# Patient Record
Sex: Male | Born: 1960 | Race: Black or African American | Hispanic: No | Marital: Single | Smoking: Never smoker
Health system: Southern US, Community
[De-identification: ages and names within clinical notes are randomized; demographics above are authoritative.]

---

## 2011-09-21 ENCOUNTER — Ambulatory Visit: Payer: Self-pay | Admitting: Gastroenterology

## 2012-04-19 ENCOUNTER — Emergency Department: Payer: Self-pay | Admitting: Emergency Medicine

## 2012-08-25 ENCOUNTER — Emergency Department: Payer: Self-pay | Admitting: Emergency Medicine

## 2012-08-25 LAB — CBC WITH DIFFERENTIAL/PLATELET
Basophil %: 0.7 %
Eosinophil #: 0.8 10*3/uL — ABNORMAL HIGH (ref 0.0–0.7)
Eosinophil %: 11.6 %
Lymphocyte #: 1.5 10*3/uL (ref 1.0–3.6)
Lymphocyte %: 22.5 %
MCHC: 35.4 g/dL (ref 32.0–36.0)
Monocyte #: 0.6 x10 3/mm (ref 0.2–1.0)
Neutrophil %: 56 %
Platelet: 151 10*3/uL (ref 150–440)
RDW: 13.5 % (ref 11.5–14.5)
WBC: 6.7 10*3/uL (ref 3.8–10.6)

## 2012-08-25 LAB — COMPREHENSIVE METABOLIC PANEL
Albumin: 4 g/dL (ref 3.4–5.0)
Alkaline Phosphatase: 108 U/L (ref 50–136)
Anion Gap: 9 (ref 7–16)
Calcium, Total: 9.4 mg/dL (ref 8.5–10.1)
Co2: 27 mmol/L (ref 21–32)
Creatinine: 1.12 mg/dL (ref 0.60–1.30)
EGFR (African American): >60
Glucose: 109 mg/dL — ABNORMAL HIGH (ref 65–99)
Osmolality: 282 (ref 275–301)
Potassium: 4 mmol/L (ref 3.5–5.1)
Total Protein: 8.3 g/dL — ABNORMAL HIGH (ref 6.4–8.2)

## 2012-11-26 ENCOUNTER — Emergency Department: Payer: Self-pay | Admitting: Emergency Medicine

## 2013-08-10 ENCOUNTER — Inpatient Hospital Stay: Payer: Self-pay | Admitting: Family Medicine

## 2013-08-10 LAB — CBC
HCT: 47.7 % (ref 40.0–52.0)
HGB: 15.8 g/dL (ref 13.0–18.0)
MCH: 32 pg (ref 26.0–34.0)
MCHC: 33.1 g/dL (ref 32.0–36.0)
MCV: 96 fL (ref 80–100)
Platelet: 151 10*3/uL (ref 150–440)
RBC: 4.94 10*6/uL (ref 4.40–5.90)
RDW: 13.7 % (ref 11.5–14.5)
WBC: 9 10*3/uL (ref 3.8–10.6)

## 2013-08-10 LAB — COMPREHENSIVE METABOLIC PANEL
ALK PHOS: 91 U/L
ALT: 28 U/L (ref 12–78)
AST: 27 U/L (ref 15–37)
Albumin: 3.6 g/dL (ref 3.4–5.0)
Anion Gap: 5 — ABNORMAL LOW (ref 7–16)
BUN: 13 mg/dL (ref 7–18)
Bilirubin,Total: 1 mg/dL (ref 0.2–1.0)
CREATININE: 0.99 mg/dL (ref 0.60–1.30)
Calcium, Total: 9 mg/dL (ref 8.5–10.1)
Chloride: 104 mmol/L (ref 98–107)
Co2: 27 mmol/L (ref 21–32)
EGFR (African American): >60
EGFR (Non-African Amer.): >60
Glucose: 99 mg/dL (ref 65–99)
OSMOLALITY: 272 (ref 275–301)
Potassium: 3.9 mmol/L (ref 3.5–5.1)
Sodium: 136 mmol/L (ref 136–145)
TOTAL PROTEIN: 7.9 g/dL (ref 6.4–8.2)

## 2013-08-10 LAB — TROPONIN I: Troponin-I: <0.02 ng/mL

## 2013-08-11 LAB — TSH: Thyroid Stimulating Horm: 0.165 u[IU]/mL — ABNORMAL LOW

## 2013-08-12 LAB — CBC WITH DIFFERENTIAL/PLATELET
Basophil #: 0 10*3/uL (ref 0.0–0.1)
Basophil %: 0.2 %
EOS PCT: 0 %
Eosinophil #: 0 10*3/uL (ref 0.0–0.7)
HCT: 43 % (ref 40.0–52.0)
HGB: 14.5 g/dL (ref 13.0–18.0)
Lymphocyte #: 0.9 10*3/uL — ABNORMAL LOW (ref 1.0–3.6)
Lymphocyte %: 5.7 %
MCH: 32.6 pg (ref 26.0–34.0)
MCHC: 33.7 g/dL (ref 32.0–36.0)
MCV: 97 fL (ref 80–100)
MONOS PCT: 3.1 %
Monocyte #: 0.5 x10 3/mm (ref 0.2–1.0)
NEUTROS ABS: 15.1 10*3/uL — AB (ref 1.4–6.5)
NEUTROS PCT: 91 %
Platelet: 166 10*3/uL (ref 150–440)
RBC: 4.45 10*6/uL (ref 4.40–5.90)
RDW: 13.7 % (ref 11.5–14.5)
WBC: 16.6 10*3/uL — AB (ref 3.8–10.6)

## 2013-08-12 LAB — BASIC METABOLIC PANEL
ANION GAP: 5 — AB (ref 7–16)
BUN: 18 mg/dL (ref 7–18)
CHLORIDE: 104 mmol/L (ref 98–107)
CREATININE: 0.93 mg/dL (ref 0.60–1.30)
Calcium, Total: 9.2 mg/dL (ref 8.5–10.1)
Co2: 26 mmol/L (ref 21–32)
EGFR (African American): >60
GLUCOSE: 144 mg/dL — AB (ref 65–99)
OSMOLALITY: 275 (ref 275–301)
Potassium: 4.5 mmol/L (ref 3.5–5.1)
Sodium: 135 mmol/L — ABNORMAL LOW (ref 136–145)

## 2014-08-21 NOTE — H&P (Signed)
PATIENT NAME:  Pedro Flores, Pedro Flores MR#:  [id number] DATE OF BIRTH:  03-May-1960  DATE OF ADMISSION:  08/10/2013  PRIMARY CARE PHYSICIAN: Mariane Masters, MD  REFERRING PHYSICIAN: Dr. Cyril Loosen  CHIEF COMPLAINT: Shortness of breath, cough, and wheezing for 4 days.   HISTORY OF PRESENT ILLNESS: This is a 54 year old African American male with a history of asthma, GERD, and TB who presented to the ED with shortness of breath, cough, and wheezing for the past 4 days. Symptoms have been worsening. The patient denies any fever or chills. No chest pain, palpitation, orthopnea, or nocturnal dyspnea or leg edema. The patient was treated with nebulizer without improvement in the ED. The patient will be admitted for COPD exacerbation.   PAST MEDICAL HISTORY: As mentioned above, GERD, asthma, and TB.   SOCIAL HISTORY: No smoking or drinking or illicit drugs.   FAMILY HISTORY: Both parents died. The patient does not know.  PAST SURGICAL HISTORY: None.   ALLERGIES: None.   HOME MEDICATIONS: 1.  Toviaz 8 mg p.o. daily.  2.  ProAir HFA CFC 19 mcg 2 tabs 4 times a day p.r.n.  3.  Omeprazole 20 mg p.o. daily.  4.  Singulair 10 mg 1 tablet in the evening.  5.  Loratadine 10 mg p.o. daily. 6.  Atorvastatin 20 mg p.o. daily.   REVIEW OF SYSTEMS: CONSTITUTIONAL: The patient denies any fever or chills. No headache or dizziness. No weakness.  EYES: No double vision, blurred vision. ENT: No postnasal drip, slurred speech or dysphagia.  CARDIOVASCULAR: No chest pain, palpitations, orthopnea, nocturnal dyspnea. No leg edema.  PULMONARY: Positive for cough, sputum, shortness of breath, and wheezing, but no hemoptysis.  GASTROINTESTINAL: No abdominal pain, nausea, vomiting or diarrhea. No melena or bloody stool. GENITOURINARY: No dysuria, hematuria, or incontinence.  SKIN: No rash or jaundice.  NEUROLOGY: No syncope, loss of consciousness or seizure.  HEMATOLOGIC: No easy bruising or bleeding.  ENDOCRINE: No  polyuria, polydipsia, heat or cold intolerance.   PHYSICAL EXAMINATION: VITAL SIGNS: Temperature 98.2, blood pressure 117/66, pulse 89, respirations 18, O2 saturation 92% in room air.  GENERAL: The patient is alert, awake, and oriented, in no acute distress.  HEENT: Pupils round, equal and reactive to light and accommodation. Moist oral mucosa. Clear oropharynx.  NECK: Supple. No JVD or carotid bruit. No lymphadenopathy. No thyromegaly.  CARDIOVASCULAR: S1 and S2 regular rate and rhythm. No murmurs or gallops. PULMONARY: Bilateral air entry. No wheezing or rales. No use of accessory muscles. Bilateral air entry. There is wheezing. No rales. No use of accessory muscles to breathe.  ABDOMEN: Soft. No distention or tenderness. No organomegaly. Bowel sounds present.  EXTREMITIES: No edema, clubbing or cyanosis. No calf tenderness. Strong bilateral pedal pulses.  SKIN: No rash or jaundice.  NEUROLOGIC: Alert and oriented x3. No focal deficit. Power 5/5. Sensation intact.   DIAGNOSTIC DATA: CBC in normal range. Glucose 99, BUN 13, creatinine 0.99. Electrolytes normal. Troponin less than 0.02.   Chest x-ray: No edema or consolidation.   IMPRESSIONS: 1.  Chronic obstructive pulmonary disease exacerbation.  2.  History of asthma.  3.  Gastroesophageal reflux disease.  PLAN OF TREATMENT: 1.  The patient will be admitted to medical floor. We will start Solu-Medrol IVPB and give DuoNeb. Continue Singulair.  2.  Gastrointestinal and deep vein thrombosis prophylaxis.   I discussed the patient's condition and plan of treatment with the patient.   TIME SPENT: About 52 minutes.  ____________________________ Shaune Pollack, MD qc:sb D: 08/10/2013 13:24:38 ET  T: 08/10/2013 13:41:33 ET JOB#: [id number]  cc: Shaune PollackQing Lafreda Casebeer, MD, <Dictator> Shaune PollackQING Raina Sole MD ELECTRONICALLY SIGNED 08/10/2013 15:40

## 2014-08-21 NOTE — Discharge Summary (Signed)
PATIENT NAME:  Pedro Flores MR#:  [id number] DATE OF BIRTH:  Jan 11, 1961  DATE OF ADMISSION:  08/10/2013 DATE OF DISCHARGE:  08/14/2013  DISPOSITION: Home. The patient also has a referral to go to a group home, which he is on a waiting period for that.   MEDICATIONS AT DISCHARGE: Claritin 10 mg once daily, Atorvastatin 20 mg at bedtime. ProAir HFA 2 puffs 4 times a day as needed for shortness of breath. Singulair 10  mg once a day, omeprazole 20 mg once a day, Toviaz 8 mg once a day, prednisone taper starting at 60 mg taper 10 mg every day until they are gone. Advair Diskus 500/50 mcg inhaler twice daily.   FOLLOW-UP: PCP  in the next 2 to 4 weeks.   HOSPITAL COURSE: This is a very nice and super sweet 54 year old gentleman who has history of asthma, GERD, TB [place] the [place] in [place] was [place]. The patient has the diagnosis that has been given from whenever he was a child of mental retardation and at this moment mostly developmental delay. The patient has been living in group homes in the past. The patient recently moved into the house of his brother who has been trying to take care of him. The patient, apparently, was not being able to take all his medications and his niece is the one who has been dealing with all his medical care, but she has a family and she does not live in the same house. His brother was supposed to be the one giving most of the care, but as per the niece, the patient has not been receiving the appropriate care due to multiple issues. Despite of that the patient came to the emergency department on 08/12/2013 complaining of shortness of breath. He has a diagnosis of asthma and he has been wheezing for 4 days. In the Emergency Department, the patient had oxygen saturation of 92% on room air with significant respiratory wheezing and tightness for which he was admitted to the hospital. He was started Solu-Medrol, continue Singulair and continue Claritin.  I started the  patient on Advair. The patient started to improve slowly, but he had a good course. As far as his other medical problems, the patient is going to a group home for what a psychiatric evaluation was needed. Dr. Toni Amendlapacs was kind enough to evaluate him for the clearance.   His white blood cells were elevated due to the steroids. His mental capacity was decreased to developmental delay, and this is not a change to his baseline.   He has hyperlipidemia, for what he is continued statins. He has overreactive bladder for what he takes Toviaz, and overall, the patient did well during this hospitalization, was discharged in good condition.   TIME SPENT: I spent about 45 minutes discharging this patient.   ____________________________ Pedro Furnaceoberto Pedro Gutierrez, Flores rsg:sg D: 08/16/2013 04:53:00 ET T: 08/16/2013 07:08:33 ET JOB#: [id number]  cc: Pedro Furnaceoberto Pedro Gutierrez, Flores, <Dictator> Hikari Tripp Juanda ChanceSANCHEZ GUTIERRE Flores ELECTRONICALLY SIGNED 09/01/2013 22:51

## 2014-08-21 NOTE — Consult Note (Signed)
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   Patient was seen by consultant.   Consult note dictated.   Comments: Psychiatry: Patient seen and chart reviewed and I spoke with his neice. Patient has no current or past psychiatric problems identified. Full note done.  Electronic Signatures: Besse Miron, Jackquline DenmarkJohn T (MD)  (Signed 15-Apr-15 18:00)  Authored: Brief Consult Note   Last Updated: 15-Apr-15 18:00 by Audery Amellapacs, Caymen Dubray T (MD)

## 2014-08-21 NOTE — Consult Note (Signed)
PATIENT NAME:  Pedro Flores, Pedro Flores MR#:  [id number] DATE OF BIRTH:  10/03/1960  DATE OF CONSULTATION:  08/12/2013  REFERRING PHYSICIAN:   CONSULTING PHYSICIAN:  Audery Amel, MD  IDENTIFYING INFORMATION AND REASON FOR CONSULT: A 54 year old gentleman currently in the hospital because of an exacerbation of his asthma. Consultation because it is required for his group home placement.   HISTORY OF PRESENT ILLNESS: Information obtained from the patient, the chart, but probably more completely from the patient's niece, Ricard Dillon. Consultation was requested because the [facility] application requests a psychiatric evaluation as part of their admission process, not because of any specific complaint or problem.  On review of the chart, there is no indication of the patient having any psychiatric problems or any psychiatric treatment. Nothing would reflect any concerns about behavior or mood problems. When I spoke to the patient, he was pleasant and interactive but clearly became confused when I started asking him questions about his past psychiatric history. His answers appeared unreliable and I just seemed to be making him nervous, so I preferred to get most of the history from his niece. Niece reports that the patient has no history of any psychiatric treatment or problems that she is aware of whatsoever. He does have mild mental retardation but has no history of psychiatric hospitalization or psychiatric medication. She says he has no history of violence or aggression toward other people and no history of self-injury, suicide, or violence toward himself. There is no history of substance abuse problems. The patient in the past has participated in structured workshop type settings and she tells me there has never been any problem there at all. On interview today, the patient tells me that his mood is fine and good. He says he is glad that he is feeling better from his asthma attack. He tells me  that he understands he is going to a group home and he is looking forward to it. He is not currently on any psychiatric medicine.   PAST PSYCHIATRIC HISTORY: To repeat what I have learned above, the patient I am told has no past history of any psychiatric treatment whatsoever. No history of being diagnosed with mood or psychotic disorder. No history of any dangerous behavior. No history of psychiatric hospitalization.   PAST MEDICAL HISTORY: The patient has asthma and is prone to decompensation if he does not stay on his medication regularly. Also has dyslipidemia. Also has gastroesophageal reflux disease.   SOCIAL HISTORY: The patient's parents are deceased. Once they passed away a few years ago, the patient and his brother moved from [place] down here to [place]. The patient has been living with his brother since then. The brother had from what I am told volunteered to take responsibility for maintaining the patient's medical condition; however, according to Ms. McDuffie, in the end she had to do pretty much all of the work. She was expected to keep all the medications filled, do all the transport to medical appointments and still became frustrated that the patient would decompensate, that home cleanliness was not adequate, and in general felt that her father was not really up to the challenge. Because Ms. McDuffie has a full-time job and family and her own life, she feels that it would be better for the patient to move into a group home. He has been in that sort of environment in the past and has functioned well there. The family intends to still be very actively involved in his  life.   SUBSTANCE ABUSE HISTORY: No history of substance abuse problems identified at all.   CURRENT MEDICATIONS: Atorvastatin 20 mg at night, Toviaz 4 mg per day, Claritin 10 mg per day. Currently, he is getting Solu-Medrol IV, which presumably will be stopped before discharge. Singulair 10 mg per day, pantoprazole 40  mg per day, albuterol ipratropium inhaler, currently has nebulizers.   ALLERGIES:  No known drug allergies.   REVIEW OF SYSTEMS: Full review of systems is completely negative with this patient.   MENTAL STATUS EXAMINATION: The patient was dressed in a hospital gown sitting up in a chair eating his supper. He was eating in a normal manner and enjoying a television show. He was appropriately interactive and conversant with me. Made good eye contact, psychomotor activity normal. Speech normal tone and volume. Affect: Smiling, a bit confused at times by my questions but appropriate. Mood stated as good. Thoughts are clearly at times simple and slow and he is easily confused by abstract ideas. When I tried to explain the purpose of the psychiatric evaluation, he clearly did not understand and became more confused. However, at no time was he aggressive or even impolite. Totally denied suicidal or homicidal ideation. Denied any hallucinations. His judgment and insight appear to be adequate given his cognitive condition. I did not press on doing a more complete cognitive exam at this time.   ASSESSMENT: This is a 54 year old gentleman who has mild mental retardation without any associated behavior or psychiatric problems. Other than his chronic COPD, he is in pretty good health. According to the family, which is the only source I have for history, the patient has never had any behavior problems at all. Niece says that the patient is a "peach" and a very sweet man who has never been aggressive to anyone as far as she knows.   At this time, I do not have any psychiatric diagnosis other than the obvious mild mental retardation. No need for any psychiatric treatment. I certainly do not see anything here that would be a contraindication to the patient functioning in a group home. It is beyond my capacity in a hospital evaluation to predict exactly how well he is going to function in a living environment, but according  to the niece, the patient can do some basic handling of small amounts of money, can move around a kitchen and do some simple cooking safely, and take care of basic household tasks without difficulty.   DIAGNOSIS, PRINCIPAL AND PRIMARY:  AXIS I: No diagnosis.  AXIS II: Mental retardation, mild without associated psychiatric illness. AXIS III:  1. Chronic obstructive pulmonary disease. 2. Gastric reflux disease.  AXIS IV: Mild to moderate from relocation.  AXIS V: Functioning at time of evaluation, 64.   ____________________________ Audery AmelJohn T. Lajuana Patchell, MD jtc:dd D: 08/12/2013 18:10:00 ET T: 08/12/2013 19:31:17 ET JOB#: [id number]  cc: Audery AmelJohn T. Emmah Bratcher, MD, <Dictator> Audery AmelJOHN T Elliett Guarisco MD ELECTRONICALLY SIGNED 08/13/2013 9:54

## 2014-10-03 ENCOUNTER — Emergency Department
Admission: EM | Admit: 2014-10-03 | Discharge: 2014-10-03 | Disposition: A | Discharge status: Home / Self Care | Payer: Medicaid Other | Attending: Emergency Medicine | Admitting: Emergency Medicine

## 2014-10-03 ENCOUNTER — Emergency Department: Payer: Medicaid Other

## 2014-10-03 ENCOUNTER — Encounter: Payer: Self-pay | Admitting: Emergency Medicine

## 2014-10-03 DIAGNOSIS — J45909 Unspecified asthma, uncomplicated: Secondary | ICD-10-CM | POA: Insufficient documentation

## 2014-10-03 DIAGNOSIS — Z79899 Other long term (current) drug therapy: Secondary | ICD-10-CM | POA: Diagnosis not present

## 2014-10-03 DIAGNOSIS — Z7951 Long term (current) use of inhaled steroids: Secondary | ICD-10-CM | POA: Diagnosis not present

## 2014-10-03 DIAGNOSIS — H6123 Impacted cerumen, bilateral: Secondary | ICD-10-CM | POA: Insufficient documentation

## 2014-10-03 DIAGNOSIS — H9203 Otalgia, bilateral: Secondary | ICD-10-CM

## 2014-10-03 DIAGNOSIS — R0981 Nasal congestion: Secondary | ICD-10-CM | POA: Diagnosis present

## 2014-10-03 MED ORDER — BUDESONIDE 1 MG/2ML IN SUSP: 1.0000 mg | Freq: Every day | RESPIRATORY_TRACT | Status: DC

## 2014-10-03 NOTE — Discharge Instructions (Signed)
Cerumen Impaction A cerumen impaction is when the wax in your ear forms a plug. This plug usually causes reduced hearing. Sometimes it also causes an earache or dizziness. Removing a cerumen impaction can be difficult and painful. The wax sticks to the ear canal. The canal is sensitive and bleeds easily. If you try to remove a heavy wax buildup with a cotton tipped swab, you may push it in further. Irrigation with water, suction, and small ear curettes may be used to clear out the wax. If the impaction is fixed to the skin in the ear canal, ear drops may be needed for a few days to loosen the wax. People who build up a lot of wax frequently can use ear wax removal products available in your local drugstore. SEEK MEDICAL CARE IF:  You develop an earache, increased hearing loss, or marked dizziness. Document Released: 05/24/2004 Document Revised: 07/09/2011 Document Reviewed: 07/14/2009 [facility] Patient Information 2015 [employer] [place]. This information is not intended to replace advice given to you by your health care provider. Make sure you discuss any questions you have with your health care provider.  Keep the ears clear of wax using warm water and peroxide as needed.

## 2014-10-03 NOTE — ED Notes (Signed)
Patient with complaint of cough and congestion times one week. Denies fever.

## 2014-10-03 NOTE — ED Provider Notes (Signed)
[facility] Emergency Department Provider Note? ____________________________________________ ? Time seen: 2205 ? I have reviewed the triage vital signs and the nursing notes. ________ HISTORY ? Chief Complaint Cough and Nasal Congestion  HPI  Pedro Flores is a 54 y.o. male brought in by his adult niece, for complaints of 3 days of decreased hearing in the ears and 2 weeks of cough and congestion. She reports that he has a history of asthma and has been without his Pulmicort nebulizers in that time. She denies any outright fevers chills or sweats and her uncle, and denies productive cough. She is here for evaluation of what she suspects is impacted wax in the ears and a flare of his asthma.    Review of Systems  Constitutional: Negative for fever. Eyes: Negative for visual changes. ENT: Negative for sore throat. Positive for decreased hearing and excessive wax. Cardiovascular: Negative for chest pain. Respiratory: Negative for shortness of breath. Positive for cough Gastrointestinal: Negative for abdominal pain, vomiting and diarrhea. Musculoskeletal: Negative for back pain. Skin: Negative for rash. Neurological: Negative for headaches, focal weakness or numbness.  10-point ROS otherwise negative. ____________________________________________  No past medical history on file.  There are no active problems to display for this patient. ? No past surgical history on file. ? Current Outpatient Rx  Name  Route  Sig  Dispense  Refill  . atorvastatin (LIPITOR) 40 MG tablet   Oral   Take 40 mg by mouth daily.         . budesonide (PULMICORT) 1 MG/2ML nebulizer solution   Nebulization   Take 1 mg by nebulization daily.         . montelukast (SINGULAIR) 10 MG tablet   Oral   Take 10 mg by mouth at bedtime.         Marland Kitchen. omeprazole (PRILOSEC) 40 MG capsule   Oral   Take 40 mg by mouth daily.         . budesonide (PULMICORT) 1 MG/2ML nebulizer  solution   Nebulization   Take 2 mLs (1 mg total) by nebulization daily.   30 mL   0   ? Allergies Review of patient's allergies indicates no known allergies. ? History reviewed. No pertinent family history. ? Social History History  Substance Use Topics  . Smoking status: Never Smoker   . Smokeless tobacco: Not on file  . Alcohol Use: Yes     Comment: occasionaly   PHYSICAL EXAM:  VITAL SIGNS: ED Triage Vitals  Enc Vitals Group     BP 10/03/14 2022 125/86 mmHg     Pulse Rate 10/03/14 2022 97     Resp 10/03/14 2022 18     Temp 10/03/14 2022 98.3 F (36.8 C)     Temp Source 10/03/14 2022 Oral     SpO2 10/03/14 2022 96 %     Weight 10/03/14 2022 160 lb (72.576 kg)     Height 10/03/14 2022 5\' 5"  (1.651 m)     Head Cir --      Peak Flow --      Pain Score --      Pain Loc --      Pain Edu? --      Excl. in GC? --    Constitutional: Alert and oriented. Well appearing and in no distress. Eyes: Conjunctivae are normal. PERRL. Normal extraocular movements. ENT   Head: Normocephalic and atraumatic.   Nose: No congestion/rhinnorhea.      Ears: Bilateral TMs obscurred  by large amount of cerumen in the canals.    Mouth/Throat: Mucous membranes are moist.      Ears: Normal external exam. Canals clear. TMs clear bilaterally.   Neck: Supple. No lymphadenopathy. Cardiovascular: Normal rate, regular rhythm. Normal and symmetric distal pulses are present in all extremities. No murmurs, rubs, or gallops. Respiratory: Normal respiratory effort without tachypnea nor retractions. Breath sounds are clear and equal bilaterally. No wheezes/rales/rhonchi. Gastrointestinal: Soft and nontender.  Musculoskeletal: Normal range of motion in all extremities.  Neurologic:  Normal speech and language. No gross focal neurologic deficits are appreciated.  Skin:  Skin is warm, dry and intact. No rash noted. Psychiatric: Mood and affect are normal. Patient exhibits appropriate insight  and judgment. _____________ PROCEDURES ? Ear wash by this provider Verbal consent obtained Ears washed using water & peroxide Flushed using 18g angiocath  Two large wax plugs removed from both ears without difficulty Patient tolerated the procedure well Hearing restored to baseline per patient. ______________________________________________________ INITIAL IMPRESSION / ASSESSMENT AND PLAN / ED COURSE ?Bilateral cerumen impaction causing acute otalgia. URI symptoms aggravated by meds having run out. Courtesy prescription for Pulmicort given. Follow-up with primary care provider.  ____________________________________________ FINAL CLINICAL IMPRESSION(S) / ED DIAGNOSES?  Final diagnoses:  Otalgia of both ears  Cerumen impaction, bilateral      Lissa Hoard, PA-C 10/03/14 2255  Darien Ramus, MD 10/03/14 [phone]

## 2014-10-03 NOTE — ED Notes (Signed)
Pt brought in by niece from home.  Pt has difficulty hearing tonight in both ears for 3 days.  Denies earache   No drainage from ears.. Pt also has a cough and congestion for 2 weeks.  No fever.  Nonsmoker.

## 2017-03-15 IMAGING — CR DG CHEST 2V
1 series · 2 of 2 positions shown · non-contrast
Comparison: 08/10/2013

CLINICAL DATA: Cough and nasal congestion.  Worsened today.

EXAM:
CHEST  2 VIEW

[Series 1: dg chest 2 view · 0.14mm/px · 2 of 2 slices shown]
[im 1/2]
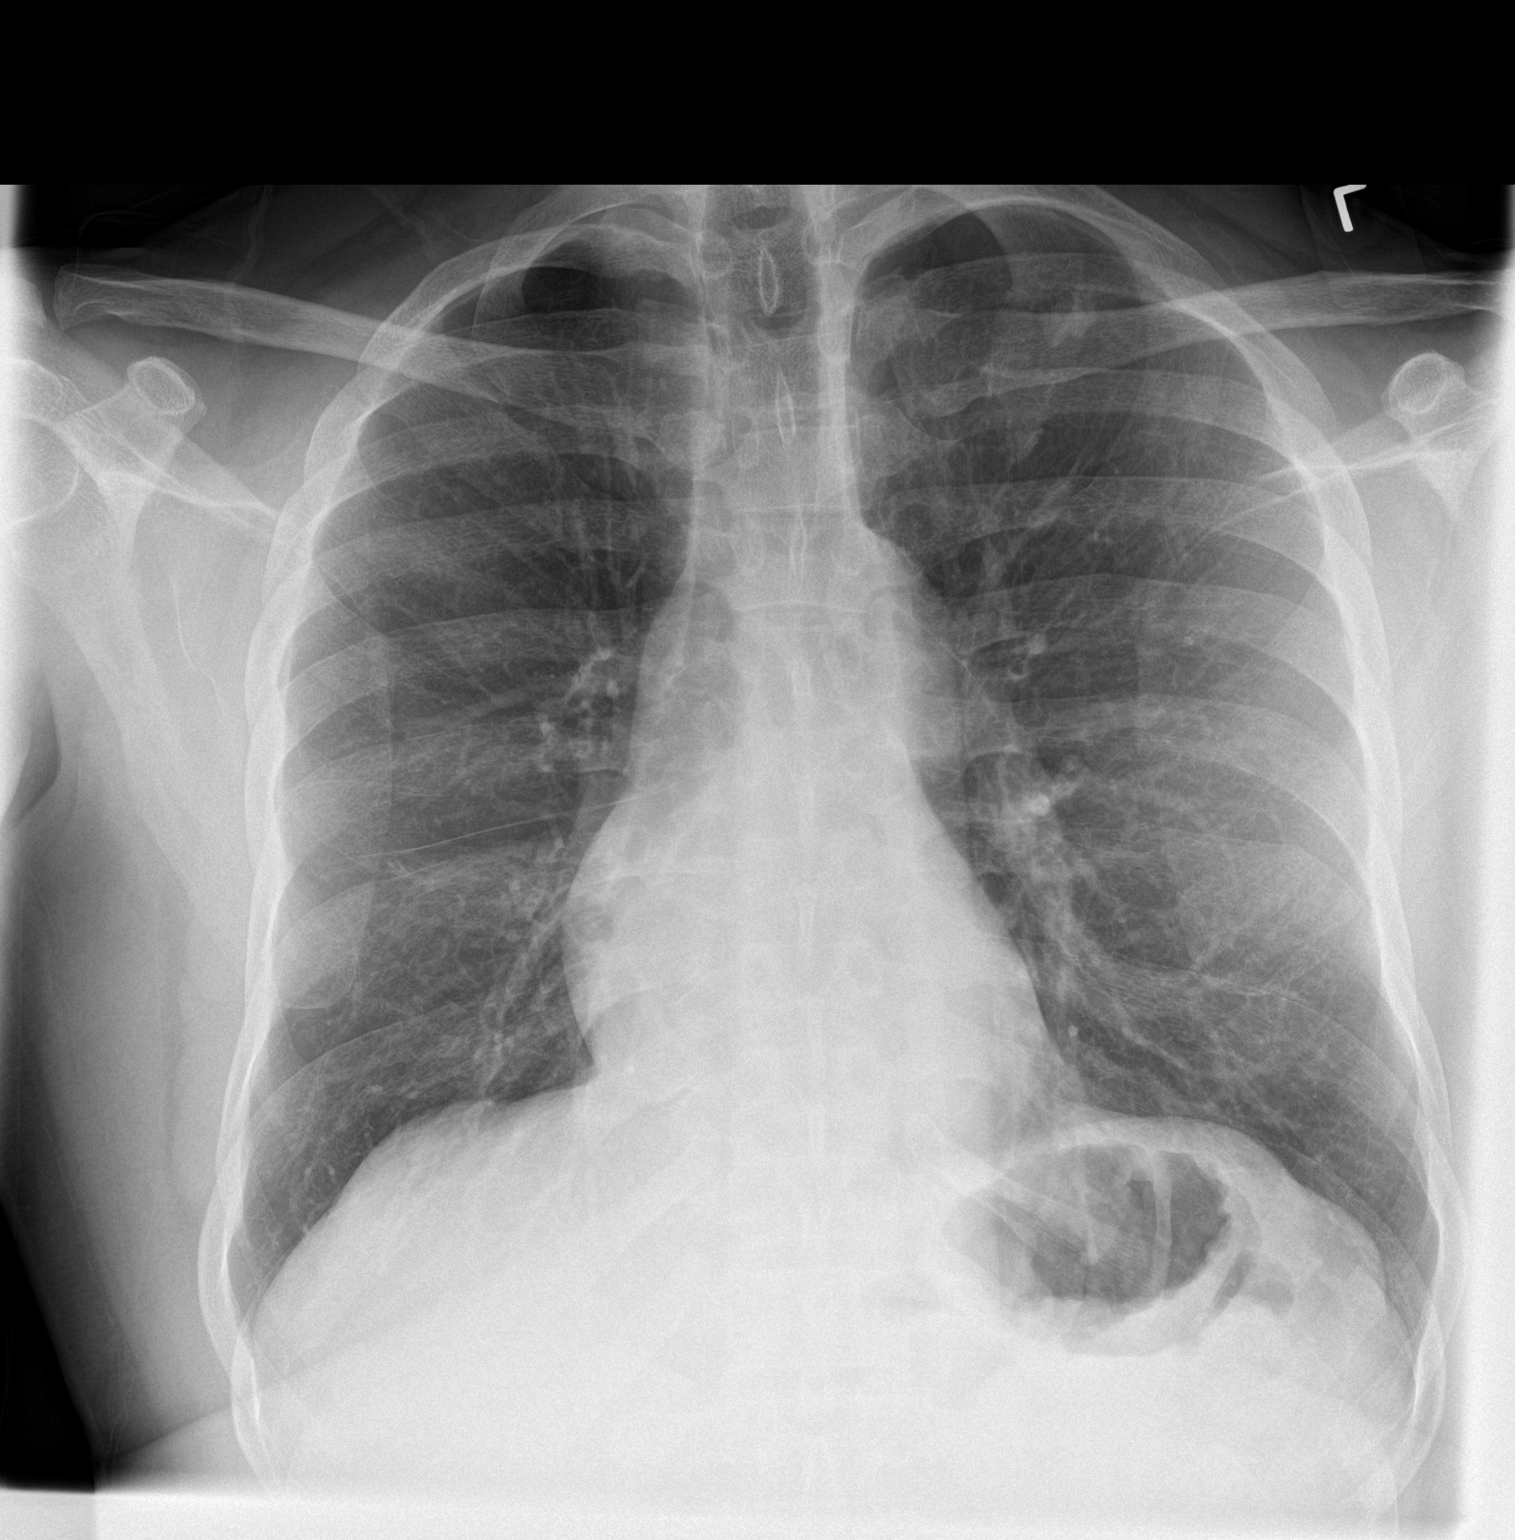
[im 2/2]
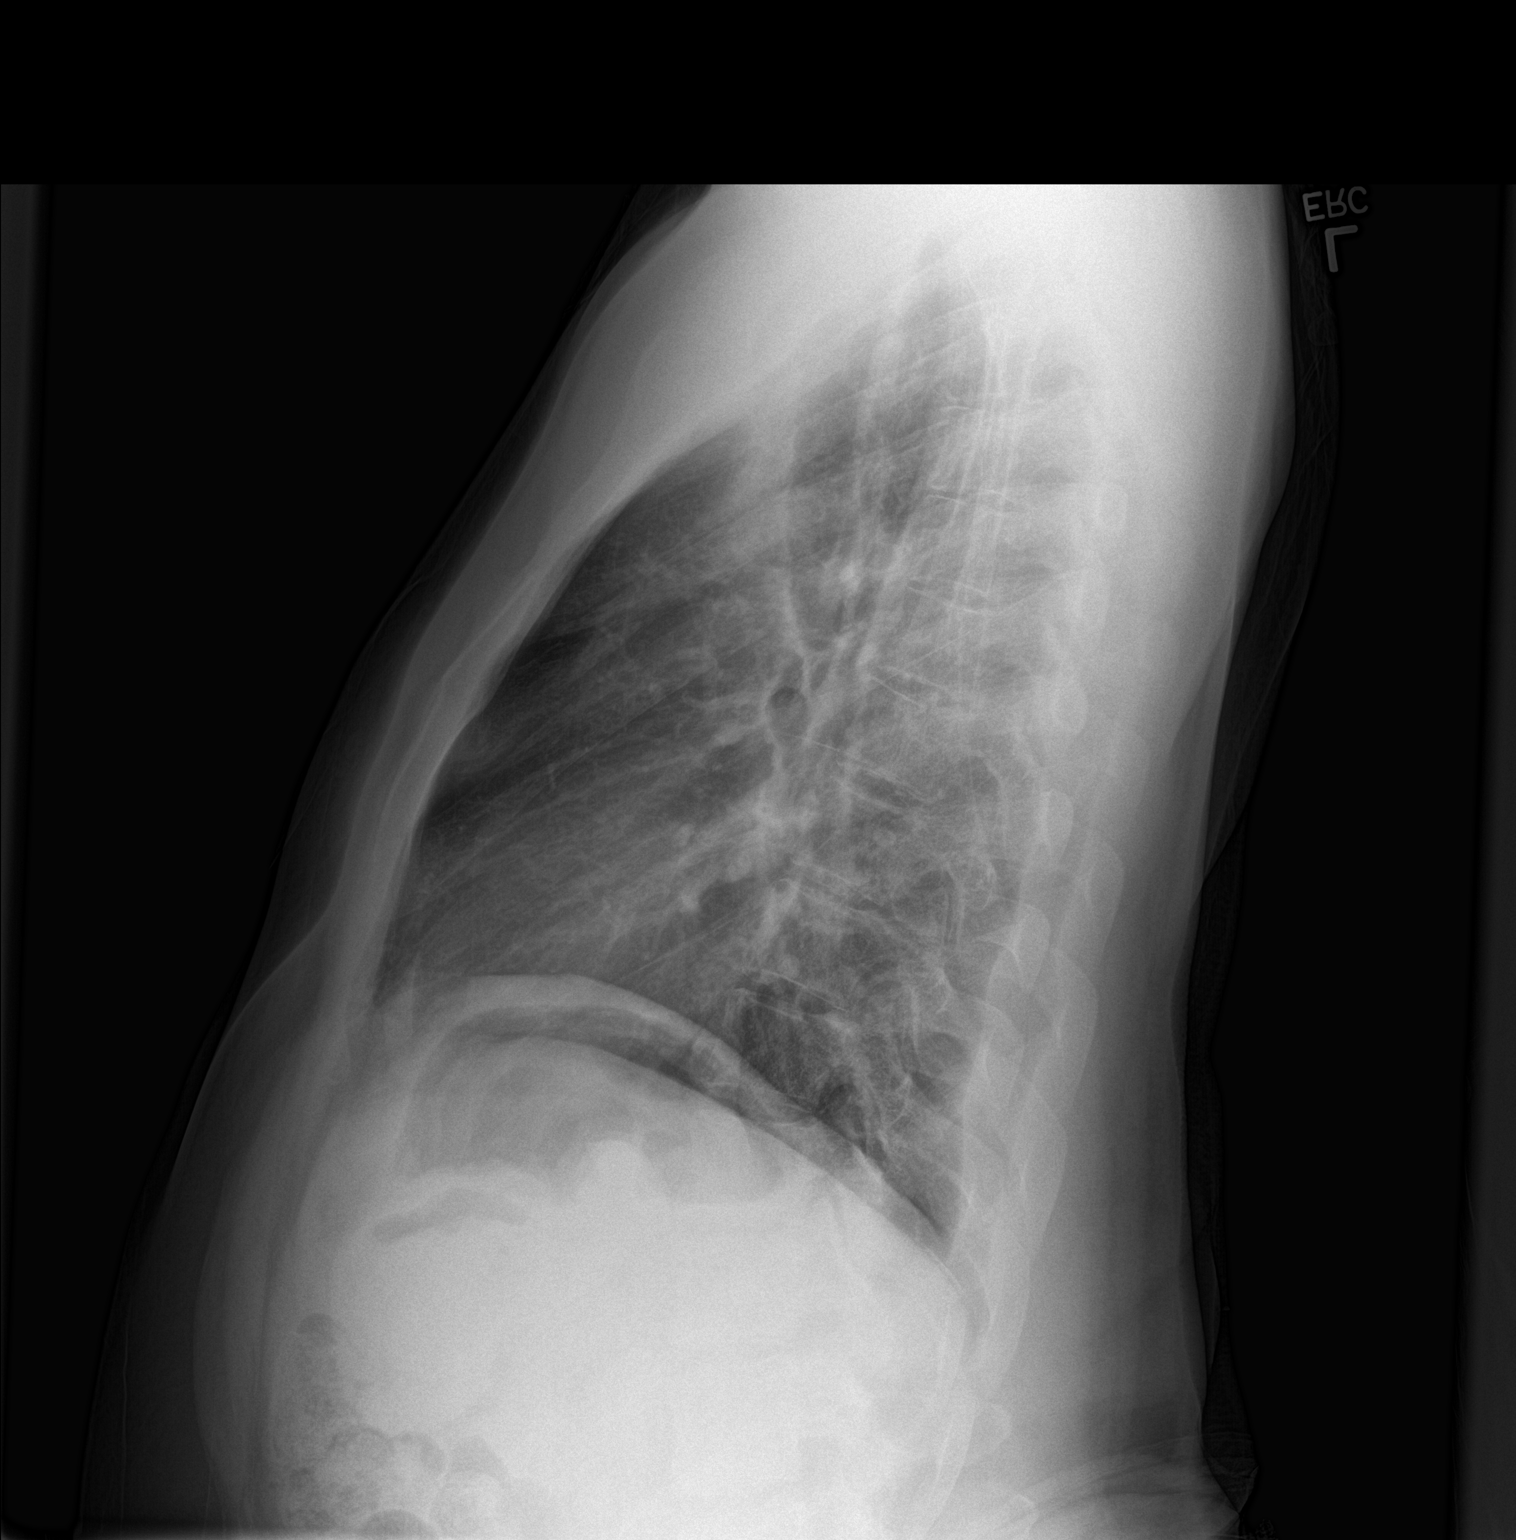

[2 of 2 positions shown; findings below may reference images not displayed]

FINDINGS: The cardiomediastinal contours are normal. There is a 1.5 cm nodular
density in the left upper lung zone projecting between the anterior
first and second rib. Pulmonary vasculature is normal. No
consolidation, pleural effusion, or pneumothorax. No acute osseous
abnormalities are seen.
IMPRESSION: 1. Question of left upper lobe pulmonary nodule. Recommend further
characterization with chest CT.
2. No acute pulmonary process.

## 2017-07-23 ENCOUNTER — Ambulatory Visit: Payer: Medicaid Other | Admitting: Podiatry

## 2017-07-23 ENCOUNTER — Encounter: Payer: Self-pay | Admitting: Podiatry

## 2017-07-23 DIAGNOSIS — B351 Tinea unguium: Secondary | ICD-10-CM | POA: Diagnosis not present

## 2017-07-23 DIAGNOSIS — M79676 Pain in unspecified toe(s): Secondary | ICD-10-CM | POA: Diagnosis not present

## 2017-07-25 NOTE — Progress Notes (Signed)
   SUBJECTIVE Patient presents to office today complaining of elongated, thickened nails that cause pain while ambulating in shoes. He is unable to trim his own nails. Patient is here for further evaluation and treatment.   No past medical history on file.  OBJECTIVE General Patient is awake, alert, and oriented x 3 and in no acute distress. Derm Skin is dry and supple bilateral. Negative open lesions or macerations. Remaining integument unremarkable. Nails are tender, long, thickened and dystrophic with subungual debris, consistent with onychomycosis, 1-5 bilateral. No signs of infection noted. Vasc  DP and PT pedal pulses palpable bilaterally. Temperature gradient within normal limits.  Neuro Epicritic and protective threshold sensation diminished bilaterally.  Musculoskeletal Exam No symptomatic pedal deformities noted bilateral. Muscular strength within normal limits.  ASSESSMENT 1. Onychodystrophic nails 1-5 bilateral with hyperkeratosis of nails.  2. Onychomycosis of nail due to dermatophyte bilateral 3. Pain in foot bilateral  PLAN OF CARE 1. Patient evaluated today.  2. Instructed to maintain good pedal hygiene and foot care.  3. Mechanical debridement of nails 1-5 bilaterally performed using a nail nipper. Filed with dremel without incident.  4. Return to clinic in 3 mos.   Daughter's name is Angie.   Felecia ShellingBrent M. Quana Chamberlain, DPM [facility]  Dr. Felecia ShellingBrent M. Seaver Machia, DPM    [address]                                        [place], [place] [address]                Office [phone]  Fax [phone]

## 2017-10-24 ENCOUNTER — Encounter: Payer: Self-pay | Admitting: Podiatry

## 2017-10-24 ENCOUNTER — Ambulatory Visit: Payer: Medicaid Other | Admitting: Podiatry

## 2017-10-24 DIAGNOSIS — B351 Tinea unguium: Secondary | ICD-10-CM | POA: Diagnosis not present

## 2017-10-24 DIAGNOSIS — M79676 Pain in unspecified toe(s): Secondary | ICD-10-CM | POA: Diagnosis not present

## 2017-10-24 NOTE — Progress Notes (Signed)
Complaint:  Visit Type: Patient returns to my office for continued preventative foot care services. Complaint: Patient states" my nails have grown long and thick and become painful to walk and wear shoes"  The patient presents for preventative foot care services. No changes to ROS.  Patient is unable to self treat.  Podiatric Exam: Vascular: dorsalis pedis and posterior tibial pulses are palpable bilateral. Capillary return is immediate. Temperature gradient is WNL. Skin turgor WNL  Sensorium: Diminished  Semmes Weinstein monofilament test. Normal tactile sensation bilaterally. Nail Exam: Pt has thick disfigured discolored nails with subungual debris noted bilateral entire nail hallux through fifth toenails Ulcer Exam: There is no evidence of ulcer or pre-ulcerative changes or infection. Orthopedic Exam: Muscle tone and strength are WNL. No limitations in general ROM. No crepitus or effusions noted. Foot type and digits show no abnormalities. Pes planus foot type.  HAV  B/L. Skin: No Porokeratosis. No infection or ulcers  Diagnosis:  Onychomycosis, , Pain in right toe, pain in left toes  Treatment & Plan Procedures and Treatment: Consent by patient was obtained for treatment procedures.   Debridement of mycotic and hypertrophic toenails, 1 through 5 bilateral and clearing of subungual debris. No ulceration, no infection noted.  Return Visit-Office Procedure: Patient instructed to return to the office for a follow up visit 4 months for continued evaluation and treatment.    Helane GuntherGregory Jalynn Betzold DPM

## 2017-11-29 ENCOUNTER — Telehealth: Payer: Self-pay | Admitting: *Deleted

## 2017-11-29 NOTE — Telephone Encounter (Signed)
[facility]s from pt's last office visit, faxed to [phone].

## 2018-02-27 ENCOUNTER — Ambulatory Visit: Payer: Medicaid Other | Admitting: Podiatry
